# Patient Record
Sex: Female | Born: 1995 | Race: Black or African American | Hispanic: No | Smoking: Never smoker
Health system: Southern US, Community
[De-identification: ages and names within clinical notes are randomized; demographics above are authoritative.]

## PROBLEM LIST (undated history)

## (undated) DIAGNOSIS — Z9109 Other allergy status, other than to drugs and biological substances: Secondary | ICD-10-CM

## (undated) HISTORY — PX: OTHER SURGICAL HISTORY: SHX169

## (undated) HISTORY — DX: Other allergy status, other than to drugs and biological substances: Z91.09

## (undated) HISTORY — PX: TONSILLECTOMY AND ADENOIDECTOMY: SUR1326

---
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
## 2013-04-01 ENCOUNTER — Ambulatory Visit (INDEPENDENT_AMBULATORY_CARE_PROVIDER_SITE_OTHER): Payer: 59 | Admitting: Medical

## 2013-04-01 ENCOUNTER — Encounter: Payer: Self-pay | Admitting: Medical

## 2013-04-01 VITALS — BP 108/70 | HR 88 | Temp 98.3°F | Resp 16 | Wt 121.0 lb

## 2013-04-01 DIAGNOSIS — J069 Acute upper respiratory infection, unspecified: Secondary | ICD-10-CM

## 2013-04-01 DIAGNOSIS — H6690 Otitis media, unspecified, unspecified ear: Secondary | ICD-10-CM

## 2013-04-01 DIAGNOSIS — H6691 Otitis media, unspecified, right ear: Secondary | ICD-10-CM

## 2013-04-01 MED ORDER — AMOXICILLIN 875 MG PO TABS: 875.0000 mg | ORAL_TABLET | Freq: Two times a day (BID) | ORAL | Status: DC

## 2013-04-01 MED ORDER — BENZONATATE 200 MG PO CAPS: 200.0000 mg | ORAL_CAPSULE | Freq: Two times a day (BID) | ORAL | Status: DC | PRN

## 2013-04-01 NOTE — Progress Notes (Signed)
Subjective:  Katie Hurst is a 17 y.o. female who presents as a new patient for illness.  Started Monday with throat.  She notes lots of mucous from nose, from coughing, voice is starting to get hoarse, right ear seems muffled, sinus pressure.  Denies fever, no NVD.  Treatment to date: salt water gargles, alka seltzer, dayquil, nyquil. + sick contacts at school, [facility].  No other aggravating or relieving factors.  No other c/o.  ROS as in subjective.    Objective: Filed Vitals:   04/01/13 1451  BP: 108/70  Pulse: 88  Temp: 98.3 F (36.8 C)  Resp: 16   General appearance: Alert, WD/WN, no distress, ill appearing                             Skin: warm, no rash                           Head: no sinus tenderness                            Eyes: conjunctiva normal, corneas clear, PERRLA                            Ears: right TM with erythema, left TM pearly, external ear canals normal                          Nose: septum midline, turbinates swollen, with erythema and clear discharge             Mouth/throat: MMM, tongue normal, mild pharyngeal erythema                           Neck: supple, no adenopathy, no thyromegaly, nontender                         Lungs: CTA bilaterally, no wheezes, rales, or rhonchi    Assessment: Encounter Diagnoses  Name Primary?  . Otitis media, right Yes  . Upper respiratory infection     Plan: Discussed diagnosis and treatment of URI and OM.  Suggested symptomatic OTC remedies.  Begin amoxicillin, tessalon Perles.  Ibuprofen OTC for fever and malaise.  Call/return in 2-3 days if symptoms aren't resolving.

## 2013-04-01 NOTE — Patient Instructions (Signed)
Begin Amoxicillin antibiotic twice daily for 10 days.  Begin Tessalon Perles cough drop as needed up to 3 times daily  Continue Alka Seltzer, rest, increase your water intake.  Continue nasal saline spray, salt water gargles, and call or return if not improving.    Otitis Media, Adult A middle ear infection is an infection in the space behind the eardrum. The medical name for this is "otitis media." It may happen after a common cold. It is caused by a germ that starts growing in that space. You may feel swollen glands in your neck on the side of the ear infection. HOME CARE INSTRUCTIONS   Take your medicine as directed until it is gone, even if you feel better after the first few days.   Only take over-the-counter or prescription medicines for pain, discomfort, or fever as directed by your caregiver.   Occasional use of a nasal decongestant a couple times per day may help with discomfort and help the eustachian tube to drain better.  Follow up with your caregiver in 10 to 14 days or as directed, to be certain that the infection has cleared. Not keeping the appointment could result in a chronic or permanent injury, pain, hearing loss and disability. If there is any problem keeping the appointment, you must call back to this facility for assistance. SEEK IMMEDIATE MEDICAL CARE IF:   You are not getting better in 2 to 3 days.   You have pain that is not controlled with medication.   You feel worse instead of better.   You cannot use the medication as directed.   You develop swelling, redness or pain around the ear or stiffness in your neck.  MAKE SURE YOU:   Understand these instructions.   Will watch your condition.   Will get help right away if you are not doing well or get worse.  Document Released: 04/25/2004 Document Revised: 04/02/2011 Document Reviewed: 02/25/2008 [facility] Patient Information 2012 [place], [place].  Upper Respiratory Infection, Adult An upper respiratory  infection (URI) is also known as the common cold. It is often caused by a type of germ (virus). Colds are easily spread (contagious). You can pass it to others by kissing, coughing, sneezing, or drinking out of the same glass. Usually, you get better in 1 or 2 weeks.  HOME CARE   Only take medicine as told by your doctor.   Use a warm mist humidifier or breathe in steam from a hot shower.   Drink enough water and fluids to keep your pee (urine) clear or pale yellow.   Get plenty of rest.   Return to work when your temperature is back to normal or as told by your doctor. You may use a face mask and wash your hands to stop your cold from spreading.  GET HELP RIGHT AWAY IF:   After the first few days, you feel you are getting worse.   You have questions about your medicine.   You have chills, shortness of breath, or brown or red spit (mucus).   You have yellow or brown snot (nasal discharge) or pain in the face, especially when you bend forward.   You have a fever, puffy (swollen) neck, pain when you swallow, or white spots in the back of your throat.   You have a bad headache, ear pain, sinus pain, or chest pain.   You have a high-pitched whistling sound when you breathe in and out (wheezing).   You have a lasting cough or  cough up blood.   You have sore muscles or a stiff neck.  MAKE SURE YOU:   Understand these instructions.   Will watch your condition.   Will get help right away if you are not doing well or get worse.  Document Released: 01/07/2008 Document Revised: 04/02/2011 Document Reviewed: 11/25/2010 [facility] Patient Information 2012 [place], [place].

## 2016-06-25 ENCOUNTER — Ambulatory Visit: Payer: Self-pay | Admitting: Medical

## 2016-10-29 ENCOUNTER — Encounter: Payer: Self-pay | Admitting: Medical

## 2016-10-29 ENCOUNTER — Ambulatory Visit (INDEPENDENT_AMBULATORY_CARE_PROVIDER_SITE_OTHER): Payer: Commercial Managed Care - PPO | Admitting: Medical

## 2016-10-29 VITALS — BP 124/82 | HR 65 | Wt 131.2 lb

## 2016-10-29 DIAGNOSIS — M6740 Ganglion, unspecified site: Secondary | ICD-10-CM

## 2016-10-29 NOTE — Patient Instructions (Signed)
Ganglion Cyst A ganglion cyst is a noncancerous, fluid-filled lump that occurs near joints or tendons. The ganglion cyst grows out of a joint or the lining of a tendon. It most often develops in the hand or wrist, but it can also develop in the shoulder, elbow, hip, knee, ankle, or foot. The round or oval ganglion cyst can be the size of a pea or larger than a grape. Increased activity may enlarge the size of the cyst because more fluid starts to build up. What are the causes? It is not known what causes a ganglion cyst to grow. However, it may be related to:  Inflammation or irritation around the joint.  An injury.  Repetitive movements or overuse.  Arthritis. What increases the risk? Risk factors include:  Being a woman.  Being age 20-50. What are the signs or symptoms? Symptoms may include:  A lump. This most often appears on the hand or wrist, but it can occur in other areas of the body.  Tingling.  Pain.  Numbness.  Muscle weakness.  Weak grip.  Less movement in a joint. How is this diagnosed? Ganglion cysts are most often diagnosed based on a physical exam. Your health care provider will feel the lump and may shine a light alongside it. If it is a ganglion cyst, a light often shines through it. Your health care provider may order an X-ray, ultrasound, or MRI to rule out other conditions. How is this treated? Ganglion cysts usually go away on their own without treatment. If pain or other symptoms are involved, treatment may be needed. Treatment is also needed if the ganglion cyst limits your movement or if it gets infected. Treatment may include:  Wearing a brace or splint on your wrist or finger.  Taking anti-inflammatory medicine.  Draining fluid from the lump with a needle (aspiration).  Injecting a steroid into the joint.  Surgery to remove the ganglion cyst. Follow these instructions at home:  Do not press on the ganglion cyst, poke it with a needle, or hit  it.  Take medicines only as directed by your health care provider.  Wear your brace or splint as directed by your health care provider.  Watch your ganglion cyst for any changes.  Keep all follow-up visits as directed by your health care provider. This is important. Contact a health care provider if:  Your ganglion cyst becomes larger or more painful.  You have increased redness, red streaks, or swelling.  You have pus coming from the lump.  You have weakness or numbness in the affected area.  You have a fever or chills. This information is not intended to replace advice given to you by your health care provider. Make sure you discuss any questions you have with your health care provider. Document Released: 07/18/2000 Document Revised: 12/27/2015 Document Reviewed: 01/03/2014 Elsevier Interactive Patient Education  2017 Elsevier Inc.  

## 2016-10-29 NOTE — Progress Notes (Signed)
Subjective: Chief Complaint  Patient presents with  . wrist swollen    wrist swollen    Here for lump on right wrist since 04/2016. The lump went away briefly then came back and has gotten bigger.  It does cause pain sometimes with wrist extension.  Her mother wanted her to get this checked out for fear of tumor/cancer.  Otherwise has been in usual state of health without c/o.  She is past due for pap, no prior pap. She does get STD screens at school.  No other c/o   Past Medical History:  Diagnosis Date  . Environmental allergies    bananas, grass   No current outpatient prescriptions on file prior to visit.   No current facility-administered medications on file prior to visit.    ROS as in subjective   Objective: BP 124/82   Pulse 65   Wt 131 lb 3.2 oz (59.5 kg)   SpO2 97%   BMI 20.86 kg/m   gen: wd, wn, nad Skin right posterior wrist over radial side with 2cm diameter mobile non tender raised nodule c/t ganglion cyst.  Mild pain with wrist extension.  otherwise nontender.   Rest of wrist and fingers and arm without deformity, normal ROM Fingers, wrist, arm neurovascularly intact    Assessment: Encounter Diagnosis  Name Primary?  . Ganglion cyst Yes    Plan: discussed findings, reassured this is not likely cancerous.   Refer to ortho for surgery consult since it is bothersome and wants it removed  F/u soon for CPX and pap

## 2016-11-17 ENCOUNTER — Ambulatory Visit (INDEPENDENT_AMBULATORY_CARE_PROVIDER_SITE_OTHER): Payer: Commercial Managed Care - PPO | Admitting: Orthopaedic Surgery

## 2016-11-17 ENCOUNTER — Encounter (INDEPENDENT_AMBULATORY_CARE_PROVIDER_SITE_OTHER): Payer: Self-pay | Admitting: Orthopaedic Surgery

## 2016-11-17 DIAGNOSIS — M67431 Ganglion, right wrist: Secondary | ICD-10-CM | POA: Diagnosis not present

## 2016-11-17 MED ORDER — BUPIVACAINE HCL 0.5 % IJ SOLN
0.5000 mL | INTRAMUSCULAR | Status: AC | PRN
  Administered 2016-11-17: .5 mL

## 2016-11-17 MED ORDER — METHYLPREDNISOLONE ACETATE 40 MG/ML IJ SUSP
20.0000 mg | INTRAMUSCULAR | Status: AC | PRN
  Administered 2016-11-17: 20 mg

## 2016-11-17 MED ORDER — LIDOCAINE HCL 1 % IJ SOLN
0.5000 mL | INTRAMUSCULAR | Status: AC | PRN
  Administered 2016-11-17: .5 mL

## 2016-11-17 NOTE — Progress Notes (Signed)
Office Visit Note   Patient: Katie Hurst           Date of Birth: July 19, 1996           MRN: [id number] Visit Date: 11/17/2016              Requested by: Jac Canavan, PA-C [address] [place], [place] [address] PCP: Ernst Breach, PA-C   Assessment & Plan: Visit Diagnoses:  1. Ganglion cyst of dorsum of right wrist     Plan: Discussed the spectrum of treatment options. We did perform aspiration and injection today under sterile conditions and also immobilize this with a wrist brace. Recommend weaning the brace after a couple weeks. Follow-up with me as needed.  Follow-Up Instructions: Return if symptoms worsen or fail to improve.   Orders:  No orders of the defined types were placed in this encounter.  No orders of the defined types were placed in this encounter.     Procedures: Hand/UE Inj Date/Time: 11/17/2016 9:33 AM Performed by: Tarry Kos Authorized by: Tarry Kos   Consent Given by:  Patient Indications:  Pain Condition: dorsal carpal ganglion   Site:  R wrist Needle Size:  18 G Approach:  Dorsal Ultrasound Guidance: No   Medications:  0.5 mL lidocaine 1 %; 0.5 mL bupivacaine 0.5 %; 20 mg methylPREDNISolone acetate 40 MG/ML     Clinical Data: No additional findings.   Subjective: Chief Complaint  Patient presents with  . Right Wrist - Cyst, Pain    Patient is a healthy 21 year old right-hand dominant female comes in with 6 month history of right dorsal wrist ganglion cyst that is bothersome mainly with activity and with writing. She denies any constitutional symptoms or any radiation of pain. No history of cancer. No skin changes.    Review of Systems  Constitutional: Negative.   HENT: Negative.   Eyes: Negative.   Respiratory: Negative.   Cardiovascular: Negative.   Endocrine: Negative.   Musculoskeletal: Negative.   Neurological: Negative.   Hematological: Negative.   Psychiatric/Behavioral: Negative.   All other  systems reviewed and are negative.    Objective: Vital Signs: There were no vitals taken for this visit.  Physical Exam  Constitutional: She is oriented to person, place, and time. She appears well-developed and well-nourished.  HENT:  Head: Normocephalic and atraumatic.  Eyes: EOM are normal.  Neck: Neck supple.  Pulmonary/Chest: Effort normal.  Abdominal: Soft.  Neurological: She is alert and oriented to person, place, and time.  Skin: Skin is warm. Capillary refill takes less than 2 seconds.  Psychiatric: She has a normal mood and affect. Her behavior is normal. Judgment and thought content normal.  Nursing note and vitals reviewed.   Ortho Exam Right wrist exam shows a 1-2 cm palpable semimobile firm mass on the dorsum of the wrist. There is no skin changes or signs of infection or aggressive features. This does transilluminate with light. Specialty Comments:  No specialty comments available.  Imaging: No results found.   PMFS History: Patient Active Problem List   Diagnosis Date Noted  . Ganglion cyst of dorsum of right wrist 11/17/2016   Past Medical History:  Diagnosis Date  . Environmental allergies    bananas, grass    No family history on file.  Past Surgical History:  Procedure Laterality Date  . TONSILLECTOMY AND ADENOIDECTOMY     4th grade  . umbilical hernia     5th grade   Social History  Occupational History  . Not on file.   Social History Main Topics  . Smoking status: Never Smoker  . Smokeless tobacco: Never Used  . Alcohol use 1.2 oz/week    1 Glasses of wine, 1 Shots of liquor per week     Comment: occ  . Drug use: No  . Sexual activity: No

## 2016-12-12 ENCOUNTER — Ambulatory Visit (INDEPENDENT_AMBULATORY_CARE_PROVIDER_SITE_OTHER): Payer: Commercial Managed Care - PPO | Admitting: Orthopaedic Surgery

## 2016-12-12 ENCOUNTER — Encounter: Payer: Self-pay | Admitting: Physician Assistant

## 2016-12-12 ENCOUNTER — Telehealth (INDEPENDENT_AMBULATORY_CARE_PROVIDER_SITE_OTHER): Payer: Self-pay | Admitting: Orthopaedic Surgery

## 2016-12-12 ENCOUNTER — Encounter (INDEPENDENT_AMBULATORY_CARE_PROVIDER_SITE_OTHER): Payer: Self-pay | Admitting: Orthopaedic Surgery

## 2016-12-12 ENCOUNTER — Ambulatory Visit (INDEPENDENT_AMBULATORY_CARE_PROVIDER_SITE_OTHER): Payer: Commercial Managed Care - PPO | Admitting: Physician Assistant

## 2016-12-12 VITALS — BP 123/80 | HR 73 | Temp 97.8°F | Resp 20 | Ht 66.73 in | Wt 131.2 lb

## 2016-12-12 DIAGNOSIS — L03111 Cellulitis of right axilla: Secondary | ICD-10-CM | POA: Diagnosis not present

## 2016-12-12 DIAGNOSIS — L732 Hidradenitis suppurativa: Secondary | ICD-10-CM | POA: Diagnosis not present

## 2016-12-12 MED ORDER — DOXYCYCLINE HYCLATE 100 MG PO TABS: 100.0000 mg | ORAL_TABLET | Freq: Two times a day (BID) | ORAL | 0 refills | Status: DC

## 2016-12-12 MED ORDER — FLUCONAZOLE 150 MG PO TABS: 150.0000 mg | ORAL_TABLET | Freq: Once | ORAL | 0 refills | Status: AC

## 2016-12-12 NOTE — Progress Notes (Signed)
   Katie HongCourtney Lorenzen  MRN: [id number] DOB: [birth year]  PCP: Jac Canavanysinger, David S, PA-C  Chief Complaint  Patient presents with  . Establish Care  . Cyst    under right arm since Tueday.     Subjective:  Pt presents to clinic for painful ;bump under her right arm for the last 4 days - it has gotten bigger every day.  Se has never had one of these before.  She used boil cream without any relief.  Using motrin for the pain and that does help.  She was seen at ortho this am because she thought it was a cyst but they told her it was an infection that they could not do it.  Review of Systems  Constitutional: Negative for chills and fever.    Patient Active Problem List   Diagnosis Date Noted  . 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 Ganglion cyst of dorsum of right wrist 11/17/2016    No current outpatient prescriptions on file prior to visit.   No current facility-administered medications on file prior to visit.     No Known Allergies  Pt patients past, family and social history were reviewed and updated.   Objective:  BP 123/80   Pulse 73   Temp 97.8 F (36.6 C) (Oral)   Resp 20   Ht 5' 6.73" (1.695 m)   Wt 131 lb 3.2 oz (59.5 kg)   LMP 11/28/2016   SpO2 98%   BMI 20.71 kg/m   Physical Exam  Constitutional: She is oriented to person, place, and time and well-developed, well-nourished, and in no distress.  HENT:  Head: Normocephalic and atraumatic.  Right Ear: Hearing and external ear normal.  Left Ear: Hearing and external ear normal.  Eyes: Conjunctivae are normal.  Neck: Normal range of motion.  Pulmonary/Chest: Effort normal.  Neurological: She is alert and oriented to person, place, and time. Gait normal.  Skin: Skin is warm and dry.  Right axillary area - 3 cm raised fluctuant tender area with erythema  Psychiatric: Mood, memory, affect and judgment normal.  Vitals reviewed.  Procedure:  Consent obtained.  Local anesthesia with 2% lido with epi.  Betadine  prep.  #11 blade used to make a 2 cm incision - purulence expressed.  1/4in plain packing placed and dressing place.  Assessment and Plan :  Cellulitis of axilla, right - Plan: Apply dressing, doxycycline (VIBRA-TABS) 100 MG tablet, fluconazole (DIFLUCAN) 150 MG tablet, Care order/instruction:    Wound care d/w pt.    Benny LennertSarah Sansa Alkema PA-C  [facility] 12/12/2016 2:00 PM

## 2016-12-12 NOTE — Progress Notes (Signed)
   Office Visit Note   Patient: Katie Hurst           Date of Birth: 02-Oct-1995           MRN: [id number] Visit Date: 12/12/2016              Requested by: Jac Canavanysinger, David S, PA-C [address] [place], [place] [id number] PCP: Jac Canavanysinger, David S, PA-C   Assessment & Plan: Visit Diagnoses:  1. Right axillary hidradenitis     Plan: Urgent referral to Dr. Abigail Miyamotoouglas Blackman of general surgery for possible surgical treatment. Questions encouraged and answered. Follow-up with me as needed.  Follow-Up Instructions: Return if symptoms worsen or fail to improve.   Orders:  Orders Placed This Encounter  Procedures  . Ambulatory referral to General Surgery   No orders of the defined types were placed in this encounter.     Procedures: No procedures performed   Clinical Data: No additional findings.   Subjective: Chief Complaint  Patient presents with  . Right Wrist - Follow-up  . Right Upper Arm - Pain, Cyst    Patient comes in today with a new complaint of a right axillary cyst. This started just a few days ago. This is very tender. Denies any constitutional symptoms.    Review of Systems  Constitutional: Negative.   HENT: Negative.   Eyes: Negative.   Respiratory: Negative.   Cardiovascular: Negative.   Endocrine: Negative.   Musculoskeletal: Negative.   Neurological: Negative.   Hematological: Negative.   Psychiatric/Behavioral: Negative.   All other systems reviewed and are negative.    Objective: Vital Signs: There were no vitals taken for this visit.  Physical Exam  Constitutional: She is oriented to person, place, and time. She appears well-developed and well-nourished.  Pulmonary/Chest: Effort normal.  Neurological: She is alert and oriented to person, place, and time.  Skin: Skin is warm. Capillary refill takes less than 2 seconds.  Psychiatric: She has a normal mood and affect. Her behavior is normal. Judgment and thought content normal.    Nursing note and vitals reviewed.   Ortho Exam Right axillary exam shows a large fluctuant area consistent with hydradenitis. This is very tender to palpation. Specialty Comments:  No specialty comments available.  Imaging: No results found.   PMFS History: Patient Active Problem List   Diagnosis Date Noted  . 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 Ganglion cyst of dorsum of right wrist 11/17/2016   Past Medical History:  Diagnosis Date  . Environmental allergies    bananas, grass    No family history on file.  Past Surgical History:  Procedure Laterality Date  . TONSILLECTOMY AND ADENOIDECTOMY     4th grade  . umbilical hernia     5th grade   Social History   Occupational History  . Not on file.   Social History Main Topics  . Smoking status: Never Smoker  . Smokeless tobacco: Never Used  . Alcohol use 1.2 oz/week    1 Glasses of wine, 1 Shots of liquor per week     Comment: occ  . Drug use: No  . Sexual activity: No

## 2016-12-12 NOTE — Telephone Encounter (Signed)
Patient's mother called expressing concern as to why Dr. Roda ShuttersXU would tell her daughter that she did not have a cyst on her wrist and refer her to another doctor.  Patient's did not wait for the referral because she was in too much pain and went to an Urgent Care where she was diagnosed with a cyst.  She feels like she is getting a bill for absolutely nothing.  She would like someone to call her.  CB#[phone].

## 2016-12-12 NOTE — Patient Instructions (Addendum)
  Warm compresses to the area.  Change the dressing at least every day or more often if it gets wet  Continue the antibiotic until it is all finished\   IF you received an x-ray today, you will receive an invoice from [facility]. Please contact [facility] at [phone] with questions or concerns regarding your invoice.   IF you received labwork today, you will receive an invoice from [facility]. Please contact [facility] at [phone] with questions or concerns regarding your invoice.   Our billing staff will not be able to assist you with questions regarding bills from these companies.  You will be contacted with the lab results as soon as they are available. The fastest way to get your results is to activate your My Chart account. Instructions are located on the last page of this paperwork. If you have not heard from us regarding the results in 2 weeks, please contact this office.

## 2016-12-16 ENCOUNTER — Encounter: Payer: Self-pay | Admitting: Physician Assistant

## 2016-12-16 ENCOUNTER — Ambulatory Visit (INDEPENDENT_AMBULATORY_CARE_PROVIDER_SITE_OTHER): Payer: Commercial Managed Care - PPO | Admitting: Physician Assistant

## 2016-12-16 VITALS — BP 118/87 | HR 75 | Temp 98.0°F | Resp 18 | Ht 66.14 in | Wt 130.6 lb

## 2016-12-16 DIAGNOSIS — L929 Granulomatous disorder of the skin and subcutaneous tissue, unspecified: Secondary | ICD-10-CM | POA: Diagnosis not present

## 2016-12-16 DIAGNOSIS — L02411 Cutaneous abscess of right axilla: Secondary | ICD-10-CM

## 2016-12-16 NOTE — Patient Instructions (Addendum)
  Granulation tissue was over growing outside of the wound - it was trimmed and dressing was placed - continue to do daily dressing changes and I only need to see you back it you have problems.  Finish all the antibiotic.   IF you received an x-ray today, you will receive an invoice from [facility]. Please contact [facility] at [phone] with questions or concerns regarding your invoice.   IF you received labwork today, you will receive an invoice from [facility]. Please contact [facility] at [phone] with questions or concerns regarding your invoice.   Our billing staff will not be able to assist you with questions regarding bills from these companies.  You will be contacted with the lab results as soon as they are available. The fastest way to get your results is to activate your My Chart account. Instructions are located on the last page of this paperwork. If you have not heard from us regarding the results in 2 weeks, please contact this office.

## 2016-12-16 NOTE — Progress Notes (Signed)
   Katie Hurst  MRN: [id number] DOB: 1996/07/16  PCP: Jac Canavanysinger, David S, PA-C  Chief Complaint  Patient presents with  . Follow-up    Subjective:  Pt presents to clinic for recheck - the pain is better but it still hurts when she raises her arm.  She is tolerating the medication ok.    Review of Systems  Constitutional: Negative for chills and fever.  Gastrointestinal: Negative for nausea.  Skin: Positive for wound. Negative for rash.    Patient Active Problem List   Diagnosis Date Noted  . 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 Ganglion cyst of dorsum of right wrist 11/17/2016    Current Outpatient Prescriptions on File Prior to Visit  Medication Sig Dispense Refill  . doxycycline (VIBRA-TABS) 100 MG tablet Take 1 tablet (100 mg total) by mouth 2 (two) times daily. 20 tablet 0   No current facility-administered medications on file prior to visit.     No Known Allergies  Pt patients past, family and social history were reviewed and updated.   Objective:  BP 118/87   Pulse 75   Temp 98 F (36.7 C) (Oral)   Resp 18   Ht 5' 6.14" (1.68 m)   Wt 130 lb 9.6 oz (59.2 kg)   LMP 11/28/2016   SpO2 98%   BMI 20.99 kg/m   Physical Exam  Constitutional: She is oriented to person, place, and time and well-developed, well-nourished, and in no distress.  HENT:  Head: Normocephalic and atraumatic.  Right Ear: Hearing and external ear normal.  Left Ear: Hearing and external ear normal.  Eyes: Conjunctivae are normal.  Neck: Normal range of motion.  Pulmonary/Chest: Effort normal.  Neurological: She is alert and oriented to person, place, and time. Gait normal.  Skin: Skin is warm and dry.  Packing is not present - there is granulation tissue present at the incision site - there is less swelling and no induration present  Psychiatric: Mood, memory, affect and judgment normal.  Vitals reviewed.  Procedure:  Consent obtained - 1% lidocaine local anesthesia -  granulation tissue removed for better wound closure - dressing placed Assessment and Plan :  Abscess of right axilla  - improved-finish abx  - f/u only needed if problems  Presence of granulation tissue removed --  Pt to continue daily dressing changes -   Benny LennertSarah Derak Schurman PA-C  Primary [facility] at [facility] 12/16/2016 10:34 AM

## 2016-12-16 NOTE — Progress Notes (Signed)
   Katie Hurst  MRN: [id number] DOB: 1996/07/02  PCP: Jac Hurst, Katie S, PA-C  Chief Complaint  Patient presents with  . Follow-up    Subjective:  Pt presents to clinic for  Review of Systems  Patient Active Problem List   Diagnosis Date Noted  . 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 Ganglion cyst of dorsum of right wrist 11/17/2016    Current Outpatient Prescriptions on File Prior to Visit  Medication Sig Dispense Refill  . doxycycline (VIBRA-TABS) 100 MG tablet Take 1 tablet (100 mg total) by mouth 2 (two) times daily. 20 tablet 0   No current facility-administered medications on file prior to visit.     No Known Allergies  Pt patients past, family and social history were reviewed and updated.   Objective:  BP 118/87   Pulse 75   Temp 98 F (36.7 C) (Oral)   Resp 18   Ht 5' 6.14" (1.68 m)   Wt 130 lb 9.6 oz (59.2 kg)   LMP 11/28/2016   SpO2 98%   BMI 20.99 kg/m   Physical Exam  Assessment and Plan :  No diagnosis found.  Katie LennertSarah Weber PA-C  Primary Care at [facility] 12/16/2016 10:08 AM

## 2017-05-07 ENCOUNTER — Ambulatory Visit
Admission: RE | Admit: 2017-05-07 | Discharge: 2017-05-07 | Disposition: A | Discharge status: Home / Self Care | Payer: Self-pay | Source: Ambulatory Visit | Attending: Medical | Admitting: Medical

## 2017-05-07 ENCOUNTER — Encounter: Payer: Self-pay | Admitting: Medical

## 2017-05-07 ENCOUNTER — Ambulatory Visit (INDEPENDENT_AMBULATORY_CARE_PROVIDER_SITE_OTHER): Payer: Commercial Managed Care - PPO | Admitting: Medical

## 2017-05-07 VITALS — BP 116/80 | HR 68 | Wt 137.2 lb

## 2017-05-07 DIAGNOSIS — M542 Cervicalgia: Secondary | ICD-10-CM

## 2017-05-07 DIAGNOSIS — R42 Dizziness and giddiness: Secondary | ICD-10-CM

## 2017-05-07 DIAGNOSIS — M549 Dorsalgia, unspecified: Secondary | ICD-10-CM

## 2017-05-07 DIAGNOSIS — G44309 Post-traumatic headache, unspecified, not intractable: Secondary | ICD-10-CM | POA: Diagnosis not present

## 2017-05-07 DIAGNOSIS — H53149 Visual discomfort, unspecified: Secondary | ICD-10-CM

## 2017-05-07 DIAGNOSIS — S060X0A Concussion without loss of consciousness, initial encounter: Secondary | ICD-10-CM | POA: Insufficient documentation

## 2017-05-07 DIAGNOSIS — S060X0D Concussion without loss of consciousness, subsequent encounter: Secondary | ICD-10-CM

## 2017-05-07 MED ORDER — HYDROCODONE-ACETAMINOPHEN 5-325 MG PO TABS: 1.0000 | ORAL_TABLET | Freq: Four times a day (QID) | ORAL | 0 refills | Status: DC | PRN

## 2017-05-07 NOTE — Addendum Note (Signed)
Addended by: Jac Canavan on: 05/07/2017 10:03 AM   Modules accepted: Orders
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
## 2017-05-07 NOTE — Addendum Note (Signed)
Addended by: Winn Jock on: 05/07/2017 09:35 AM   Modules accepted: Orders

## 2017-05-11 ENCOUNTER — Telehealth: Payer: Self-pay | Admitting: Medical

## 2017-05-11 NOTE — Telephone Encounter (Signed)
Pt called to let Vincenza Hews know that she is still having difficulty with light, tv, computer screens. All of this has made it impossible for her to attend online classes and complete assignments last week. Can she get a note for her school?
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
## 2017-05-13 ENCOUNTER — Encounter: Payer: Self-pay | Admitting: Medical

## 2017-05-13 ENCOUNTER — Ambulatory Visit (INDEPENDENT_AMBULATORY_CARE_PROVIDER_SITE_OTHER): Payer: Commercial Managed Care - PPO | Admitting: Medical

## 2017-05-13 VITALS — BP 118/80 | HR 73 | Wt 138.2 lb

## 2017-05-13 DIAGNOSIS — H53149 Visual discomfort, unspecified: Secondary | ICD-10-CM | POA: Diagnosis not present

## 2017-05-13 DIAGNOSIS — M549 Dorsalgia, unspecified: Secondary | ICD-10-CM

## 2017-05-13 DIAGNOSIS — R42 Dizziness and giddiness: Secondary | ICD-10-CM

## 2017-05-13 DIAGNOSIS — S060X0D Concussion without loss of consciousness, subsequent encounter: Secondary | ICD-10-CM

## 2017-05-13 DIAGNOSIS — M542 Cervicalgia: Secondary | ICD-10-CM | POA: Diagnosis not present

## 2017-05-13 DIAGNOSIS — G44309 Post-traumatic headache, unspecified, not intractable: Secondary | ICD-10-CM

## 2017-05-13 MED ORDER — NAPROXEN 250 MG PO TABS: 250.0000 mg | ORAL_TABLET | Freq: Three times a day (TID) | ORAL | 0 refills | Status: DC | PRN
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
## 2017-05-26 ENCOUNTER — Encounter: Payer: Self-pay | Admitting: Medical

## 2017-05-26 ENCOUNTER — Ambulatory Visit (INDEPENDENT_AMBULATORY_CARE_PROVIDER_SITE_OTHER): Payer: Commercial Managed Care - PPO | Admitting: Medical

## 2017-05-26 VITALS — BP 110/68 | HR 79 | Wt 134.0 lb

## 2017-05-26 DIAGNOSIS — R42 Dizziness and giddiness: Secondary | ICD-10-CM | POA: Diagnosis not present

## 2017-05-26 DIAGNOSIS — G44309 Post-traumatic headache, unspecified, not intractable: Secondary | ICD-10-CM | POA: Diagnosis not present

## 2017-05-26 DIAGNOSIS — M549 Dorsalgia, unspecified: Secondary | ICD-10-CM

## 2017-05-26 DIAGNOSIS — M542 Cervicalgia: Secondary | ICD-10-CM
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
## 2017-08-07 ENCOUNTER — Ambulatory Visit: Payer: Commercial Managed Care - PPO | Admitting: Medical

## 2017-08-14 ENCOUNTER — Ambulatory Visit: Payer: Commercial Managed Care - PPO | Admitting: Medical

## 2017-08-14 ENCOUNTER — Encounter: Payer: Self-pay | Admitting: Medical

## 2017-08-14 VITALS — BP 108/68 | HR 78 | Wt 130.4 lb

## 2017-08-14 DIAGNOSIS — M542 Cervicalgia: Secondary | ICD-10-CM | POA: Diagnosis not present

## 2017-08-14 DIAGNOSIS — M62838 Other muscle spasm: Secondary | ICD-10-CM | POA: Diagnosis not present

## 2017-08-14 MED ORDER — METHOCARBAMOL 500 MG PO TABS: 500.0000 mg | ORAL_TABLET | Freq: Every evening | ORAL | 1 refills | Status: AC | PRN
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
## 2017-10-20 DIAGNOSIS — Z0279 Encounter for issue of other medical certificate: Secondary | ICD-10-CM
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
